# Patient Record
Sex: Male | Born: 1945 | Race: White | Hispanic: No | Marital: Married | Smoking: Never smoker
Health system: Southern US, Community
[De-identification: ages and names within clinical notes are randomized; demographics above are authoritative.]

## PROBLEM LIST (undated history)

## (undated) DIAGNOSIS — I4891 Unspecified atrial fibrillation: Secondary | ICD-10-CM

## (undated) HISTORY — PX: CHOLECYSTECTOMY: SHX55

## (undated) HISTORY — PX: REPLACEMENT TOTAL KNEE: SUR1224

---

## 2019-09-30 ENCOUNTER — Emergency Department (HOSPITAL_BASED_OUTPATIENT_CLINIC_OR_DEPARTMENT_OTHER): Payer: Medicare Other

## 2019-09-30 ENCOUNTER — Emergency Department (HOSPITAL_BASED_OUTPATIENT_CLINIC_OR_DEPARTMENT_OTHER)
Admission: EM | Admit: 2019-09-30 | Discharge: 2019-09-30 | Disposition: A | Discharge status: Home / Self Care | Payer: Medicare Other | Attending: Emergency Medicine | Admitting: Emergency Medicine

## 2019-09-30 ENCOUNTER — Other Ambulatory Visit: Payer: Self-pay

## 2019-09-30 ENCOUNTER — Encounter (HOSPITAL_BASED_OUTPATIENT_CLINIC_OR_DEPARTMENT_OTHER): Payer: Self-pay | Admitting: Emergency Medicine

## 2019-09-30 DIAGNOSIS — R600 Localized edema: Secondary | ICD-10-CM | POA: Insufficient documentation

## 2019-09-30 DIAGNOSIS — S8011XA Contusion of right lower leg, initial encounter: Secondary | ICD-10-CM | POA: Diagnosis not present

## 2019-09-30 DIAGNOSIS — Z7982 Long term (current) use of aspirin: Secondary | ICD-10-CM | POA: Insufficient documentation

## 2019-09-30 DIAGNOSIS — W010XXA Fall on same level from slipping, tripping and stumbling without subsequent striking against object, initial encounter: Secondary | ICD-10-CM | POA: Diagnosis not present

## 2019-09-30 DIAGNOSIS — Y999 Unspecified external cause status: Secondary | ICD-10-CM | POA: Diagnosis not present

## 2019-09-30 DIAGNOSIS — Y939 Activity, unspecified: Secondary | ICD-10-CM | POA: Insufficient documentation

## 2019-09-30 DIAGNOSIS — T148XXA Other injury of unspecified body region, initial encounter: Secondary | ICD-10-CM

## 2019-09-30 DIAGNOSIS — Z79899 Other long term (current) drug therapy: Secondary | ICD-10-CM | POA: Insufficient documentation

## 2019-09-30 DIAGNOSIS — Y929 Unspecified place or not applicable: Secondary | ICD-10-CM | POA: Diagnosis not present

## 2019-09-30 DIAGNOSIS — M7989 Other specified soft tissue disorders: Secondary | ICD-10-CM

## 2019-09-30 DIAGNOSIS — S8991XA Unspecified injury of right lower leg, initial encounter: Secondary | ICD-10-CM | POA: Diagnosis present

## 2019-09-30 HISTORY — DX: Unspecified atrial fibrillation: I48.91

## 2019-09-30 NOTE — Discharge Instructions (Signed)
1.  The bruising along the outside of your leg is due to a resolving, large bruise from your initial injury. 2.  Continue to elevate your leg is much as possible. 3.  Follow-up with your doctor for recheck. 4.  Return to the emergency department if you get redness around the original wound, increasing pain or redness or other concerning symptoms.

## 2019-09-30 NOTE — ED Triage Notes (Signed)
Pain to R leg from shin and radiates up behind his knee x 2 days. Concerned about a blood clot

## 2019-09-30 NOTE — ED Notes (Signed)
Pharmacy and medications updated with patient 

## 2019-09-30 NOTE — ED Provider Notes (Signed)
[facility] EMERGENCY DEPARTMENT Provider Note   CSN: [id number] Arrival date & time: 09/30/19  1150     History Chief Complaint  Patient presents with  . Leg Pain    Calvin Miles is a 74 y.o. male.  HPI Patient reports that he slipped on the rung of a ladder about 2 weeks ago.  He hit his anterior shin just below his knee pretty hard.  He reports that almost completely healed.  There is just now a very small scab.  Patient reports however he has developed large amount of discoloration of purpleish on the outside of his right leg and even down around the sole of his foot.  He is not having significant pain anymore but he was concerned that he might of gotten a blood clot.  He has been up and walking on the leg.  He has not had significant swelling of the knee.    Past Medical History:  Diagnosis Date  . Atrial fibrillation (HCC)     There are no problems to display for this patient.   Past Surgical History:  Procedure Laterality Date  . CHOLECYSTECTOMY    . REPLACEMENT TOTAL KNEE         No family history on file.  Social History   Tobacco Use  . Smoking status: Never Smoker  . Smokeless tobacco: Never Used  Substance Use Topics  . Alcohol use: Not Currently  . Drug use: Never    Home Medications Prior to Admission medications   Medication Sig Start Date End Date Taking? Authorizing Provider  metoprolol tartrate (LOPRESSOR) 50 MG tablet TAKE ONE TABLET BY MOUTH TWO TIMES A DAY AS NEEDED FOR BLOOD PRESSURE 07/25/19  Yes [provider]  aspirin 81 MG EC tablet Take by mouth.    [provider]    Allergies    Patient has no known allergies.  Review of Systems   Review of Systems Constitutional: No fever no chills Respiratory: No shortness of breath no cough Vascular: No chest pain no syncope Physical Exam Updated Vital Signs BP 125/67 (BP Location: Left Arm)   Pulse (!) 58   Temp 98 F (36.7 C) (Oral)   Resp 16   Ht 5'  10" (1.778 m)   Wt 117.9 kg   SpO2 98%   BMI 37.31 kg/m   Physical Exam Constitutional:      Comments: Alert nontoxic well in appearance no respiratory distress  Eyes:     Extraocular Movements: Extraocular movements intact.  Pulmonary:     Effort: Pulmonary effort is normal.  Musculoskeletal:     Comments: Patient's right leg has a very small nearly healed scab about 3 mm on the upper anterior tibial surface.  No effusion of the right knee.  Patient does have well-healed surgical scar from knee replacement on the right anterior knee.  Slightly limited range of motion of the right knee which patient describes as chronic after his knee surgery.  Nontender in the popliteal fossa.  Patient has purpleish ecchymoses over the lateral aspect of the calf down to the ankle and along the sole of the foot.  No erythema, this is not warm to the touch.  Mild general edema of the lower portion of the leg relative to the left.  Foot is warm and dry and vascularly intact.  Skin:    General: Skin is warm and dry.  Neurological:     General: No focal deficit present.     Mental Status:  He is oriented to person, place, and time.     Coordination: Coordination normal.  Psychiatric:        Mood and Affect: Mood normal.     ED Results / Procedures / Treatments   Labs (all labs ordered are listed, but only abnormal results are displayed) Labs Reviewed - No data to display  EKG None  Radiology US Venous Img Lower Unilateral Right  Result Date: 09/30/2019 CLINICAL DATA:  Right lateral knee and lower leg pain and bruising since injury 2-3 weeks ago. EXAM: RIGHT LOWER EXTREMITY VENOUS DOPPLER ULTRASOUND TECHNIQUE: Gray-scale sonography with compression, as well as color and duplex ultrasound, were performed to evaluate the deep venous system(s) from the level of the common femoral vein through the popliteal and proximal calf veins. COMPARISON:  None. FINDINGS: VENOUS Normal compressibility of the common  femoral, superficial femoral, and popliteal veins, as well as the visualized calf veins. Visualized portions of profunda femoral vein and great saphenous vein unremarkable. No filling defects to suggest DVT on grayscale or color Doppler imaging. Doppler waveforms show normal direction of venous flow, normal respiratory plasticity and response to augmentation. Limited views of the contralateral common femoral vein are unremarkable. OTHER None. Limitations: none IMPRESSION: Negative. Electronically Signed   By: Elberta Fortis M.D.   On: 09/30/2019 14:25    Procedures Procedures (including critical care time)  Medications Ordered in ED Medications - No data to display  ED Course  I have reviewed the triage vital signs and the nursing notes.  Pertinent labs & imaging results that were available during my care of the patient were reviewed by me and considered in my medical decision making (see chart for details).    MDM Rules/Calculators/A&P                          Patient had concern for blood clot due to purplish bruising along the lateral aspect of the leg.  This is the resolving phase of what apparently was a much larger hematoma on the anterior surface of the leg.  No warmth or signs of cellulitis.  Calf is soft and pliable.  Foot is warm and dry without any edema.  Findings consistent with a resolving hematoma with gravitational distribution of blood.  DVT study negative.  Patient counseled on return precautions. Final Clinical Impression(s) / ED Diagnoses Final diagnoses:  Right leg swelling  Hematoma    Rx / DC Orders ED Discharge Orders    None       Arby Barrette, MD 09/30/19 1511

## 2019-10-26 ENCOUNTER — Ambulatory Visit (HOSPITAL_COMMUNITY)
Admission: RE | Admit: 2019-10-26 | Discharge: 2019-10-26 | Disposition: A | Discharge status: Home / Self Care | Payer: Medicare Other | Source: Ambulatory Visit | Attending: Pulmonary Disease | Admitting: Pulmonary Disease

## 2019-10-26 ENCOUNTER — Other Ambulatory Visit (HOSPITAL_COMMUNITY): Payer: Self-pay | Admitting: Adult Health

## 2019-10-26 DIAGNOSIS — U071 COVID-19: Secondary | ICD-10-CM | POA: Diagnosis present

## 2019-10-26 DIAGNOSIS — Z23 Encounter for immunization: Secondary | ICD-10-CM | POA: Diagnosis not present

## 2019-10-26 MED ORDER — ALBUTEROL SULFATE HFA 108 (90 BASE) MCG/ACT IN AERS: 2.0000 | INHALATION_SPRAY | Freq: Once | RESPIRATORY_TRACT | Status: DC | PRN

## 2019-10-26 MED ORDER — SODIUM CHLORIDE 0.9 % IV SOLN
1200.0000 mg | Freq: Once | INTRAVENOUS | Status: AC
  Administered 2019-10-26: 1200 mg via INTRAVENOUS
  Filled 2019-10-26: qty 10

## 2019-10-26 MED ORDER — SODIUM CHLORIDE 0.9 % IV SOLN: INTRAVENOUS | Status: DC | PRN

## 2019-10-26 MED ORDER — METHYLPREDNISOLONE SODIUM SUCC 125 MG IJ SOLR: 125.0000 mg | Freq: Once | INTRAMUSCULAR | Status: DC | PRN

## 2019-10-26 MED ORDER — DIPHENHYDRAMINE HCL 50 MG/ML IJ SOLN: 50.0000 mg | Freq: Once | INTRAMUSCULAR | Status: DC | PRN

## 2019-10-26 MED ORDER — FAMOTIDINE IN NACL 20-0.9 MG/50ML-% IV SOLN: 20.0000 mg | Freq: Once | INTRAVENOUS | Status: DC | PRN

## 2019-10-26 MED ORDER — EPINEPHRINE 0.3 MG/0.3ML IJ SOAJ: 0.3000 mg | Freq: Once | INTRAMUSCULAR | Status: DC | PRN

## 2019-10-26 NOTE — Discharge Instructions (Signed)

## 2019-10-26 NOTE — Progress Notes (Signed)
I connected by phone with Reola Mosher on 10/26/2019 at 9:06 AM to discuss the potential use of a new treatment for mild to moderate COVID-19 viral infection in non-hospitalized patients.  This patient is a 74 y.o. male that meets the FDA criteria for Emergency Use Authorization of COVID monoclonal antibody casirivimab/imdevimab.  Has a (+) direct SARS-CoV-2 viral test result  Has mild or moderate COVID-19   Is NOT hospitalized due to COVID-19  Is within 10 days of symptom onset  Has at least one of the high risk factor(s) for progression to severe COVID-19 and/or hospitalization as defined in EUA.  Specific high risk criteria : Older age (>/= 73 yo)   I have spoken and communicated the following to the patient or parent/caregiver regarding COVID monoclonal antibody treatment:  1. FDA has authorized the emergency use for the treatment of mild to moderate COVID-19 in adults and pediatric patients with positive results of direct SARS-CoV-2 viral testing who are 20 years of age and older weighing at least 40 kg, and who are at high risk for progressing to severe COVID-19 and/or hospitalization.  2. The significant known and potential risks and benefits of COVID monoclonal antibody, and the extent to which such potential risks and benefits are unknown.  3. Information on available alternative treatments and the risks and benefits of those alternatives, including clinical trials.  4. Patients treated with COVID monoclonal antibody should continue to self-isolate and use infection control measures (e.g., wear mask, isolate, social distance, avoid sharing personal items, clean and disinfect "high touch" surfaces, and frequent handwashing) according to CDC guidelines.   5. The patient or parent/caregiver has the option to accept or refuse COVID monoclonal antibody treatment.  After reviewing this information with the patient, The patient agreed to proceed with receiving casirivimab\imdevimab  infusion and will be provided a copy of the Fact sheet prior to receiving the infusion. Noreene Filbert 10/26/2019 9:06 AM

## 2019-10-26 NOTE — Progress Notes (Signed)
°  Diagnosis: COVID-19 ° °Physician:Dr wright ° °Procedure: [facility] Med: casirivimab\imdevimab infusion - Provided patient with casirivimab\imdevimab fact sheet for patients, parents and caregivers prior to infusion. ° °Complications: No immediate complications noted. ° °Discharge: Discharged home  ° °Calvin Miles °10/26/2019 ° °

## 2020-11-30 ENCOUNTER — Encounter (HOSPITAL_BASED_OUTPATIENT_CLINIC_OR_DEPARTMENT_OTHER): Payer: Self-pay

## 2020-11-30 ENCOUNTER — Emergency Department (HOSPITAL_BASED_OUTPATIENT_CLINIC_OR_DEPARTMENT_OTHER)
Admission: EM | Admit: 2020-11-30 | Discharge: 2020-11-30 | Disposition: A | Discharge status: Home / Self Care | Payer: Medicare Other | Attending: Emergency Medicine | Admitting: Emergency Medicine

## 2020-11-30 ENCOUNTER — Other Ambulatory Visit: Payer: Self-pay

## 2020-11-30 DIAGNOSIS — R059 Cough, unspecified: Secondary | ICD-10-CM | POA: Diagnosis present

## 2020-11-30 DIAGNOSIS — U071 COVID-19: Secondary | ICD-10-CM | POA: Insufficient documentation

## 2020-11-30 DIAGNOSIS — Z7982 Long term (current) use of aspirin: Secondary | ICD-10-CM | POA: Insufficient documentation

## 2020-11-30 DIAGNOSIS — R000 Tachycardia, unspecified: Secondary | ICD-10-CM | POA: Insufficient documentation

## 2020-11-30 DIAGNOSIS — Z96659 Presence of unspecified artificial knee joint: Secondary | ICD-10-CM | POA: Diagnosis not present

## 2020-11-30 LAB — COMPREHENSIVE METABOLIC PANEL
ALT: 16 U/L (ref 0–44)
AST: 24 U/L (ref 15–41)
Albumin: 4 g/dL (ref 3.5–5.0)
Alkaline Phosphatase: 88 U/L (ref 38–126)
Anion gap: 9 (ref 5–15)
BUN: 16 mg/dL (ref 8–23)
CO2: 26 mmol/L (ref 22–32)
Calcium: 9 mg/dL (ref 8.9–10.3)
Chloride: 103 mmol/L (ref 98–111)
Creatinine, Ser: 0.91 mg/dL (ref 0.61–1.24)
GFR, Estimated: >60 mL/min (ref >60)
Glucose, Bld: 111 mg/dL — ABNORMAL HIGH (ref 70–99)
Potassium: 4.2 mmol/L (ref 3.5–5.1)
Sodium: 138 mmol/L (ref 135–145)
Total Bilirubin: 0.7 mg/dL (ref 0.3–1.2)
Total Protein: 7.6 g/dL (ref 6.5–8.1)

## 2020-11-30 LAB — CBC WITH DIFFERENTIAL/PLATELET
Abs Immature Granulocytes: 0.01 10*3/uL (ref 0.00–0.07)
Basophils Absolute: 0.1 10*3/uL (ref 0.0–0.1)
Basophils Relative: 1 %
Eosinophils Absolute: 0.5 10*3/uL (ref 0.0–0.5)
Eosinophils Relative: 8 %
HCT: 41.7 % (ref 39.0–52.0)
Hemoglobin: 13.6 g/dL (ref 13.0–17.0)
Immature Granulocytes: 0 %
Lymphocytes Relative: 13 %
Lymphs Abs: 0.8 10*3/uL (ref 0.7–4.0)
MCH: 29.8 pg (ref 26.0–34.0)
MCHC: 32.6 g/dL (ref 30.0–36.0)
MCV: 91.4 fL (ref 80.0–100.0)
Monocytes Absolute: 1.1 10*3/uL — ABNORMAL HIGH (ref 0.1–1.0)
Monocytes Relative: 18 %
Neutro Abs: 3.7 10*3/uL (ref 1.7–7.7)
Neutrophils Relative %: 60 %
Platelets: 164 10*3/uL (ref 150–400)
RBC: 4.56 MIL/uL (ref 4.22–5.81)
RDW: 13.2 % (ref 11.5–15.5)
WBC: 6.1 10*3/uL (ref 4.0–10.5)
nRBC: 0 % (ref 0.0–0.2)

## 2020-11-30 MED ORDER — BENZONATATE 100 MG PO CAPS: 100.0000 mg | ORAL_CAPSULE | Freq: Three times a day (TID) | ORAL | 0 refills | Status: AC

## 2020-11-30 MED ORDER — ONDANSETRON 4 MG PO TBDP: ORAL_TABLET | ORAL | 0 refills | Status: AC

## 2020-11-30 MED ORDER — SODIUM CHLORIDE 0.9 % IV BOLUS
1000.0000 mL | Freq: Once | INTRAVENOUS | Status: AC
  Administered 2020-11-30: 1000 mL via INTRAVENOUS

## 2020-11-30 MED ORDER — NIRMATRELVIR/RITONAVIR (PAXLOVID)TABLET: 3.0000 | ORAL_TABLET | Freq: Two times a day (BID) | ORAL | 0 refills | Status: AC

## 2020-11-30 NOTE — ED Notes (Signed)
Pt ambulated in room wearing pulse ox due to recent COVID pos test. O2 saturation maintained >97% throughout ambulation

## 2020-11-30 NOTE — ED Provider Notes (Signed)
[facility] EMERGENCY DEPARTMENT Provider Note   CSN: [id number] Arrival date & time: 11/30/20  1121     History Chief Complaint  Patient presents with   Cough   Fatigue    Calvin Miles is a 75 y.o. male.  75 yo M with a chief complaints of having COVID.  The patient states he has been sick for about 3 days now.  Took a home test that was positive.  Has felt fatigued decreased appetite weak.  He denies nausea vomiting or diarrhea.  Denies significant difficulty breathing.  The history is provided by the patient.  Cough Associated symptoms: no chest pain, no chills, no eye discharge, no fever, no headaches, no myalgias, no rash and no shortness of breath   Illness Severity:  Moderate Onset quality:  Gradual Duration:  3 days Timing:  Constant Progression:  Worsening Chronicity:  New Associated symptoms: cough   Associated symptoms: no abdominal pain, no chest pain, no congestion, no diarrhea, no fever, no headaches, no myalgias, no rash, no shortness of breath and no vomiting       Past Medical History:  Diagnosis Date   Atrial fibrillation (HCC)     There are no problems to display for this patient.   Past Surgical History:  Procedure Laterality Date   CHOLECYSTECTOMY     REPLACEMENT TOTAL KNEE         No family history on file.  Social History   Tobacco Use   Smoking status: Never   Smokeless tobacco: Never  Substance Use Topics   Alcohol use: Not Currently   Drug use: Never    Home Medications Prior to Admission medications   Medication Sig Start Date End Date Taking? Authorizing Provider  benzonatate (TESSALON) 100 MG capsule Take 1 capsule (100 mg total) by mouth every 8 (eight) hours. 11/30/20  Yes Melene Plan, DO  nirmatrelvir/ritonavir EUA (PAXLOVID) 20 x 150 MG & 10 x 100MG  TABS Take 3 tablets by mouth 2 (two) times daily for 5 days. Take nirmatrelvir (150 mg) two tablets twice daily for 5 days and ritonavir (100 mg) one tablet  twice daily for 5 days. 11/30/20 12/05/20 Yes 12/07/20, DO  ondansetron (ZOFRAN ODT) 4 MG disintegrating tablet 4mg  ODT q4 hours prn nausea/vomit 11/30/20  Yes , DO  aspirin 81 MG EC tablet Take by mouth.    [provider]  metoprolol tartrate (LOPRESSOR) 50 MG tablet TAKE ONE TABLET BY MOUTH TWO TIMES A DAY AS NEEDED FOR BLOOD PRESSURE 07/25/19   [provider]    Allergies    Patient has no known allergies.  Review of Systems   Review of Systems  Constitutional:  Negative for chills and fever.  HENT:  Negative for congestion and facial swelling.   Eyes:  Negative for discharge and visual disturbance.  Respiratory:  Positive for cough. Negative for shortness of breath.   Cardiovascular:  Negative for chest pain and palpitations.  Gastrointestinal:  Negative for abdominal pain, diarrhea and vomiting.  Musculoskeletal:  Negative for arthralgias and myalgias.  Skin:  Negative for color change and rash.  Neurological:  Negative for tremors, syncope and headaches.  Psychiatric/Behavioral:  Negative for confusion and dysphoric mood.    Physical Exam Updated Vital Signs BP 113/65   Pulse 94   Temp (!) 97.5 F (36.4 C)   Resp 18   Ht 5\' 10"  (1.778 m)   Wt 112 kg   SpO2 95%   BMI 35.44 kg/m  Physical Exam Vitals and nursing note reviewed.  Constitutional:      Appearance: He is well-developed.  HENT:     Head: Normocephalic and atraumatic.  Eyes:     Pupils: Pupils are equal, round, and reactive to light.  Neck:     Vascular: No JVD.  Cardiovascular:     Rate and Rhythm: Tachycardia present. Rhythm irregular.     Heart sounds: No murmur heard.   No friction rub. No gallop.  Pulmonary:     Effort: No respiratory distress.     Breath sounds: No wheezing.  Abdominal:     General: There is no distension.     Tenderness: There is no abdominal tenderness. There is no guarding or rebound.  Musculoskeletal:        General: Normal range of motion.      Cervical back: Normal range of motion and neck supple.  Skin:    Coloration: Skin is not pale.     Findings: No rash.  Neurological:     Mental Status: He is alert and oriented to person, place, and time.  Psychiatric:        Behavior: Behavior normal.    ED Results / Procedures / Treatments   Labs (all labs ordered are listed, but only abnormal results are displayed) Labs Reviewed  CBC WITH DIFFERENTIAL/PLATELET - Abnormal; Notable for the following components:      Result Value   Monocytes Absolute 1.1 (*)    All other components within normal limits  COMPREHENSIVE METABOLIC PANEL - Abnormal; Notable for the following components:   Glucose, Bld 111 (*)    All other components within normal limits    EKG EKG Interpretation  Date/Time:  Saturday November 30 2020 11:35:50 EDT Ventricular Rate:  124 PR Interval:    QRS Duration: 91 QT Interval:  312 QTC Calculation: 445 R Axis:   3 Text Interpretation: Atrial fibrillation Ventricular premature complex Borderline repolarization abnormality No old tracing to compare Confirmed by Melene Plan [phone]) on 11/30/2020 11:47:31 AM  Radiology No results found.  Procedures Procedures   Medications Ordered in ED Medications  sodium chloride 0.9 % bolus 1,000 mL (0 mLs Intravenous Stopped 11/30/20 1225)    ED Course  I have reviewed the triage vital signs and the nursing notes.  Pertinent labs & imaging results that were available during my care of the patient were reviewed by me and considered in my medical decision making (see chart for details).    MDM Rules/Calculators/A&P                           75 yo M with a chief complaints of cough congestion fatigue.  Going on for about 3 days now.  Had a home test that was positive for COVID.  No tachypnea on exam.  No abdominal discomfort.  Clear lung sounds bilaterally.  Mildly tachycardic.  History of A. fib.  Will ambulate with a pulse ox.  Basic blood work with  tachycardia.  Bolus of IV fluids.  Reassess.  Lab work is unremarkable no significant anemia no significant electrolyte abnormality.  Patient able to ambulate here without hypoxia.  Will discharge home.  PCP follow-up.  12:38 PM:  I have discussed the diagnosis/risks/treatment options with the patient and believe the pt to be eligible for discharge home to follow-up with PCP. We also discussed returning to the ED immediately if new or worsening sx occur. We discussed the sx which  are most concerning (e.g., sudden worsening pain, fever, inability to tolerate by mouth) that necessitate immediate return. Medications administered to the patient during their visit and any new prescriptions provided to the patient are listed below.  Medications given during this visit Medications  sodium chloride 0.9 % bolus 1,000 mL (0 mLs Intravenous Stopped 11/30/20 1225)     The patient appears reasonably screen and/or stabilized for discharge and I doubt any other medical condition or other [facility] requiring further screening, evaluation, or treatment in the ED at this time prior to discharge.    Final Clinical Impression(s) / ED Diagnoses Final diagnoses:  COVID-19 virus infection    Rx / DC Orders ED Discharge Orders          Ordered    benzonatate (TESSALON) 100 MG capsule  Every 8 hours        11/30/20 1219    ondansetron (ZOFRAN ODT) 4 MG disintegrating tablet        11/30/20 1219    nirmatrelvir/ritonavir EUA (PAXLOVID) 20 x 150 MG & 10 x 100MG  TABS  2 times daily        11/30/20 1219             12/02/20, DO 11/30/20 1238

## 2020-11-30 NOTE — ED Triage Notes (Signed)
Pt reports testing positive for COVID yesterday with at home test. Pt reports having cough, sore throat, malaise since Wednesday.

## 2020-11-30 NOTE — Discharge Instructions (Signed)
Take tylenol 2 pills 4 times a day and motrin 4 pills 3 times a day.  Drink plenty of fluids.  Return for worsening shortness of breath, headache, confusion. Follow up with your family doctor.   

## 2021-04-18 IMAGING — US US EXTREM LOW VENOUS*R*
1 series · 14 of 24 positions shown · non-contrast
Comparison: None.

CLINICAL DATA: Right lateral knee and lower leg pain and bruising
since injury 2-3 weeks ago.

EXAM:
RIGHT LOWER EXTREMITY VENOUS DOPPLER ULTRASOUND
TECHNIQUE: Gray-scale sonography with compression, as well as color and duplex
ultrasound, were performed to evaluate the deep venous system(s)
from the level of the common femoral vein through the popliteal and
proximal calf veins.

[Series 1: us extrem low venous*right* · 14 of 31 slices shown]
[im 1/31]
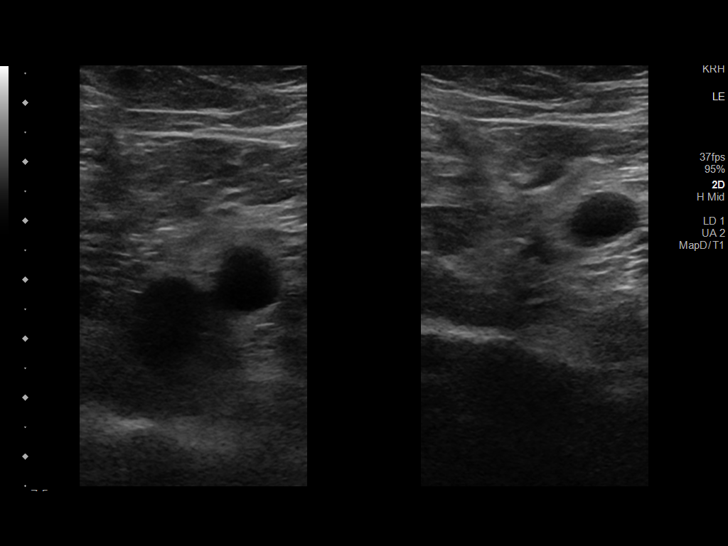
[im 3/31]
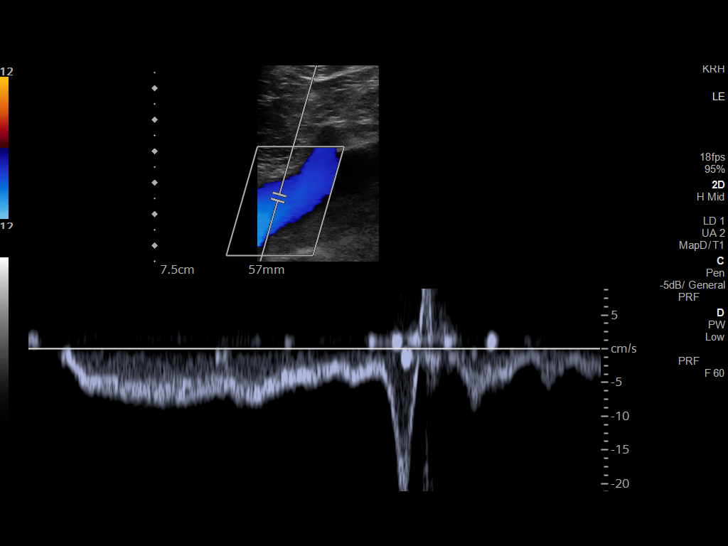
[im 6/31]
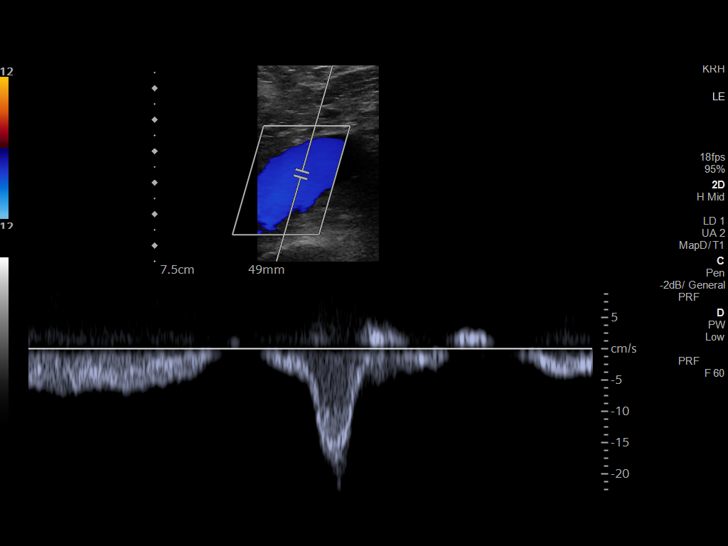
[im 8/31]
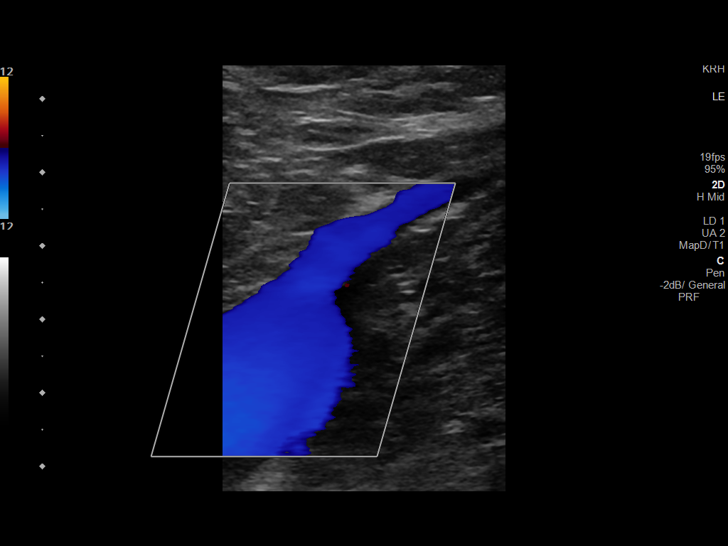
[im 10/31]
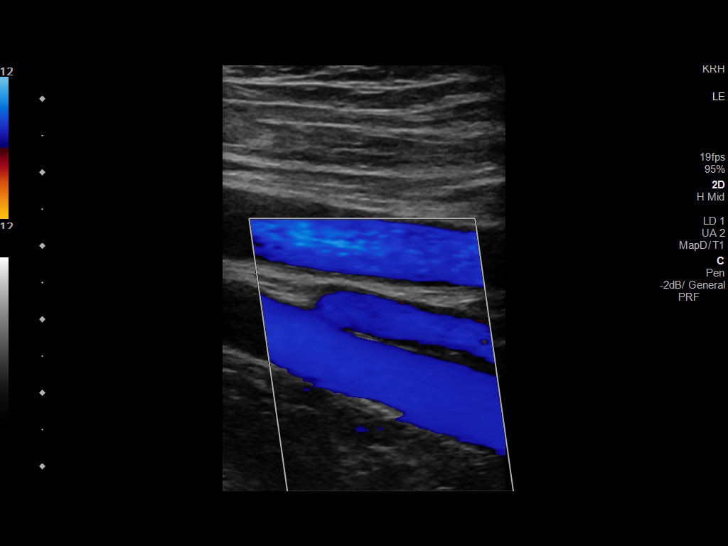
[im 12/31]
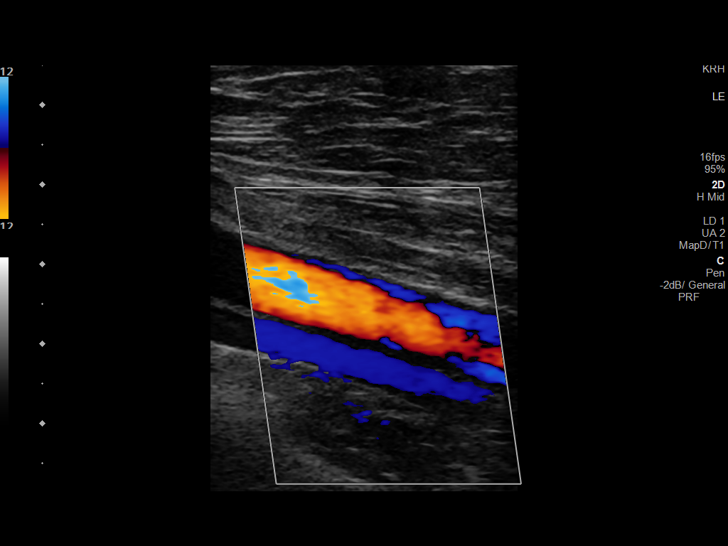
[im 15/31]
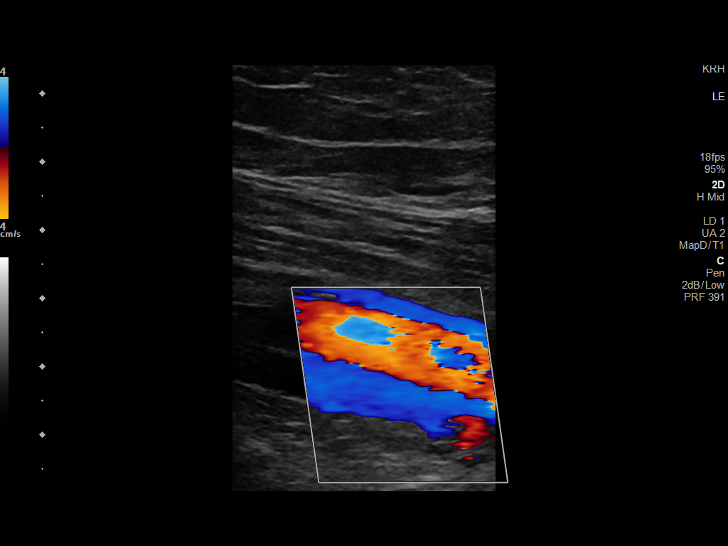
[im 16/31]
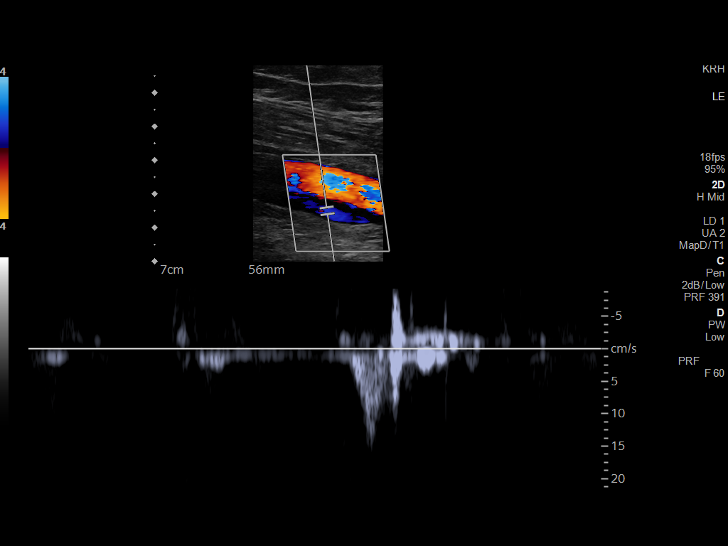
[im 19/31]
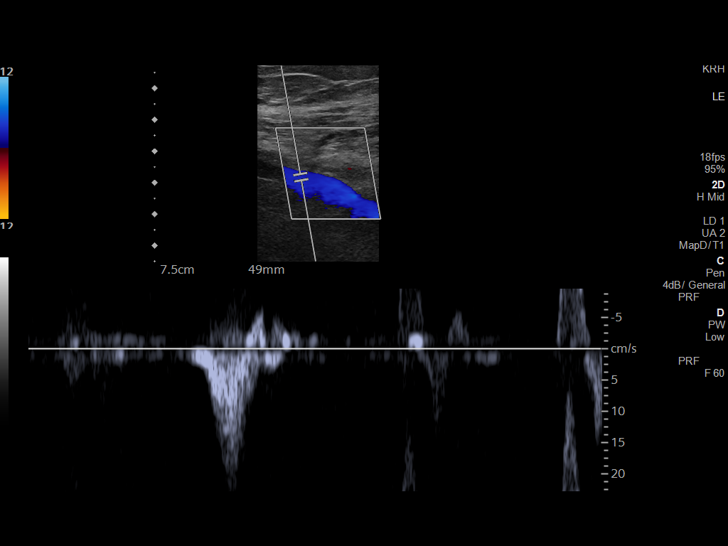
[im 21/31]
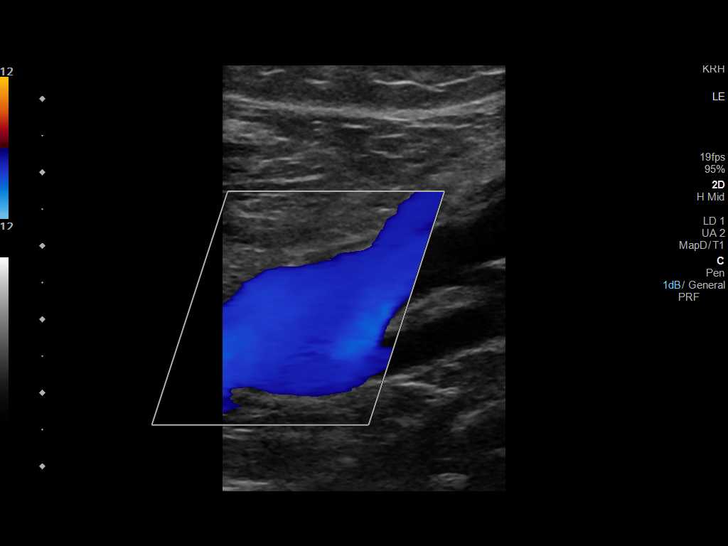
[im 24/31]
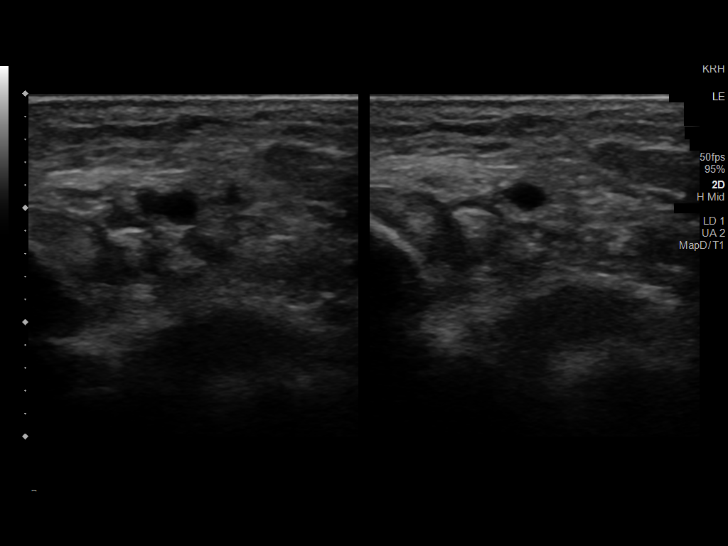
[im 25/31]
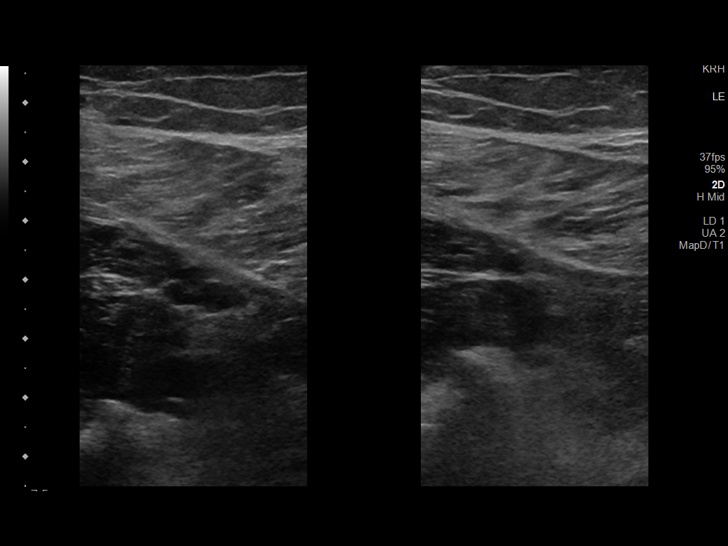
[im 28/31]
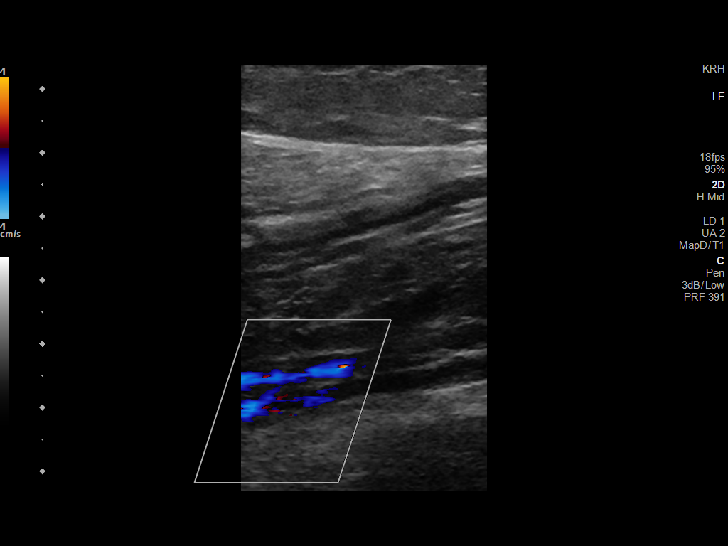
[im 31/31]
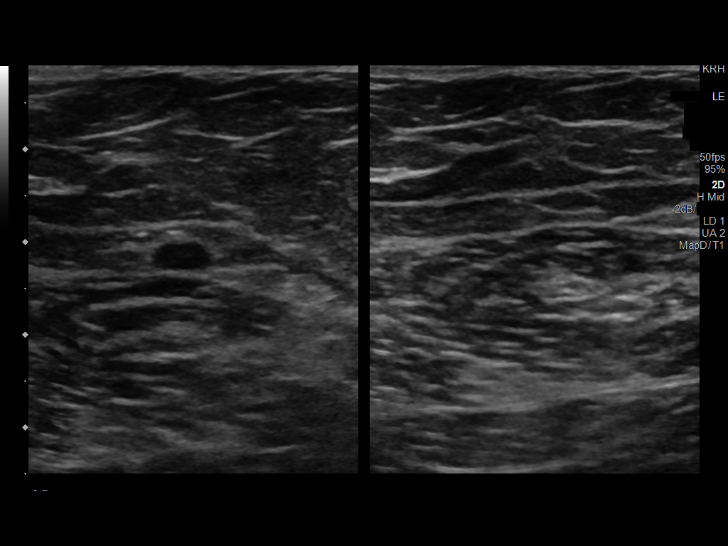

[14 of 24 positions shown; findings below may reference images not displayed]

FINDINGS: VENOUS

Normal compressibility of the common femoral, superficial femoral,
and popliteal veins, as well as the visualized calf veins.
Visualized portions of profunda femoral vein and great saphenous
vein unremarkable. No filling defects to suggest DVT on grayscale or
color Doppler imaging. Doppler waveforms show normal direction of
venous flow, normal respiratory plasticity and response to
augmentation.

Limited views of the contralateral common femoral vein are
unremarkable.

OTHER

None.

Limitations: none
IMPRESSION: Negative.

## 2022-11-27 DIAGNOSIS — Z23 Encounter for immunization: Secondary | ICD-10-CM | POA: Diagnosis not present

## 2022-12-07 DIAGNOSIS — K648 Other hemorrhoids: Secondary | ICD-10-CM | POA: Diagnosis not present

## 2022-12-07 DIAGNOSIS — Z1211 Encounter for screening for malignant neoplasm of colon: Secondary | ICD-10-CM | POA: Diagnosis not present

## 2022-12-07 DIAGNOSIS — K5730 Diverticulosis of large intestine without perforation or abscess without bleeding: Secondary | ICD-10-CM | POA: Diagnosis not present
